# Patient Record
Sex: Male | Born: 1995 | Race: Black or African American | Hispanic: No | Marital: Single | Smoking: Never smoker
Health system: Southern US, Community
[De-identification: ages and names within clinical notes are randomized; demographics above are authoritative.]

## PROBLEM LIST (undated history)

## (undated) DIAGNOSIS — J45909 Unspecified asthma, uncomplicated: Secondary | ICD-10-CM

---
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
## 2017-01-18 ENCOUNTER — Encounter (HOSPITAL_COMMUNITY): Payer: Self-pay | Admitting: *Deleted

## 2017-01-18 ENCOUNTER — Emergency Department (HOSPITAL_COMMUNITY)
Admission: EM | Admit: 2017-01-18 | Discharge: 2017-01-18 | Disposition: A | Discharge status: Home / Self Care | Payer: Self-pay | Attending: Emergency Medicine | Admitting: Emergency Medicine

## 2017-01-18 DIAGNOSIS — J069 Acute upper respiratory infection, unspecified: Secondary | ICD-10-CM | POA: Insufficient documentation

## 2017-01-18 DIAGNOSIS — Z79899 Other long term (current) drug therapy: Secondary | ICD-10-CM | POA: Insufficient documentation

## 2017-01-18 NOTE — ED Triage Notes (Signed)
Pt is here with cold symptoms since Friday and complains of throat pain, headache, nasal congestion, and needs a work note

## 2017-01-18 NOTE — ED Provider Notes (Signed)
[facility] Note   CSN: [id number] Arrival date & time: 01/18/17  1739     History   Chief Complaint Chief Complaint  Patient presents with  . Nasal Congestion    HPI Leon Baker is a 21 y.o. male.  The history is provided by the patient. No language interpreter was used.  URI   This is a new problem. The current episode started more than 2 days ago. The problem has not changed since onset.There has been no fever. The fever has been present for less than 1 day. Associated symptoms include sore throat. He has tried nothing for the symptoms. The treatment provided no relief.  Pt complains of a runny nose, sore throat and congestion.   History reviewed. No pertinent past medical history.  There are no active problems to display for this patient.   History reviewed. No pertinent surgical history.     Home Medications    Prior to Admission medications   Medication Sig Start Date End Date Taking? Authorizing Provider  cetirizine-pseudoephedrine (ZYRTEC-D) 5-120 MG tablet Take 1 tablet by mouth daily. 08/10/15   Bethel BornGekas, Kelly Marie, PA-C  ibuprofen (ADVIL,MOTRIN) 400 MG tablet Take 1 tablet (400 mg total) by mouth every 6 (six) hours as needed. 08/10/15   Bethel BornGekas, Kelly Marie, PA-C  omeprazole (PRILOSEC) 20 MG capsule Take 1 capsule (20 mg total) by mouth 2 (two) times daily before a meal. 02/15/15 03/08/15  Truddie CocoBush, Tamika, DO    Family History No family history on file.  Social History Social History  Substance Use Topics  . Smoking status: Never Smoker  . Smokeless tobacco: Never Used  . Alcohol use No     Allergies   Patient has no known allergies.   Review of Systems Review of Systems  HENT: Positive for sore throat.   All other systems reviewed and are negative.    Physical Exam Updated Vital Signs BP 121/82 (BP Location: Left Arm)   Pulse 62   Temp 98 F (36.7 C) (Oral)   Resp 18   SpO2 100%   Physical Exam  Constitutional: He  appears well-developed and well-nourished.  HENT:  Head: Normocephalic and atraumatic.  Right Ear: External ear normal.  Left Ear: External ear normal.  Nose: Nose normal.  Mouth/Throat: Oropharynx is clear and moist.  Eyes: Conjunctivae are normal.  Neck: Neck supple.  Cardiovascular: Normal rate and regular rhythm.   No murmur heard. Pulmonary/Chest: Effort normal and breath sounds normal. No respiratory distress.  Abdominal: Soft. There is no tenderness.  Musculoskeletal: He exhibits no edema.  Neurological: He is alert.  Skin: Skin is warm and dry.  Psychiatric: He has a normal mood and affect.  Nursing note and vitals reviewed.    ED Treatments / Results  Labs (all labs ordered are listed, but only abnormal results are displayed) Labs Reviewed - No data to display  EKG  EKG Interpretation None       Radiology No results found.  Procedures Procedures (including critical care time)  Medications Ordered in ED Medications - No data to display   Initial Impression / Assessment and Plan / ED Course  I have reviewed the triage vital signs and the nursing notes.  Pertinent labs & imaging results that were available during my care of the patient were reviewed by me and considered in my medical decision making (see chart for details).       Final Clinical Impressions(s) / ED Diagnoses   Final diagnoses:  Upper respiratory tract infection, unspecified type    New Prescriptions New Prescriptions   No medications on file  An After Visit Summary was printed and given to the patient.    Osie Cheeks 01/18/17 Raina Mina, MD 01/19/17 [phone]

## 2017-03-04 ENCOUNTER — Emergency Department (HOSPITAL_COMMUNITY)
Admission: EM | Admit: 2017-03-04 | Discharge: 2017-03-04 | Disposition: A | Discharge status: Home / Self Care | Payer: Self-pay | Attending: Emergency Medicine | Admitting: Emergency Medicine

## 2017-03-04 ENCOUNTER — Encounter (HOSPITAL_COMMUNITY): Payer: Self-pay | Admitting: Emergency Medicine

## 2017-03-04 DIAGNOSIS — Z5321 Procedure and treatment not carried out due to patient leaving prior to being seen by health care provider: Secondary | ICD-10-CM | POA: Insufficient documentation

## 2017-03-04 HISTORY — DX: Unspecified asthma, uncomplicated: J45.909

## 2017-03-04 NOTE — ED Triage Notes (Signed)
Pt c/o cyst to left wrist causing pain ongoing since 10th grade.

## 2017-03-04 NOTE — ED Triage Notes (Signed)
Unable to locate Pt in front lobby. Pt name called multiple with no answer.
